# Patient Record
Sex: Female | Born: 1965 | Race: Black or African American | Hispanic: No | Marital: Single | Smoking: Never smoker
Health system: Southern US, Community
[De-identification: ages and names within clinical notes are randomized; demographics above are authoritative.]

## PROBLEM LIST (undated history)

## (undated) DIAGNOSIS — S82892A Other fracture of left lower leg, initial encounter for closed fracture: Secondary | ICD-10-CM

## (undated) DIAGNOSIS — Z973 Presence of spectacles and contact lenses: Secondary | ICD-10-CM

## (undated) DIAGNOSIS — J302 Other seasonal allergic rhinitis: Secondary | ICD-10-CM

## (undated) HISTORY — PX: NO PAST SURGERIES: SHX2092

---

## 2016-03-17 ENCOUNTER — Encounter (HOSPITAL_COMMUNITY): Payer: Self-pay | Admitting: *Deleted

## 2016-03-17 ENCOUNTER — Emergency Department (HOSPITAL_COMMUNITY): Payer: BLUE CROSS/BLUE SHIELD

## 2016-03-17 ENCOUNTER — Emergency Department (HOSPITAL_COMMUNITY)
Admission: EM | Admit: 2016-03-17 | Discharge: 2016-03-17 | Disposition: A | Discharge status: Home / Self Care | Payer: BLUE CROSS/BLUE SHIELD | Attending: Emergency Medicine | Admitting: Emergency Medicine

## 2016-03-17 DIAGNOSIS — X501XXA Overexertion from prolonged static or awkward postures, initial encounter: Secondary | ICD-10-CM | POA: Diagnosis not present

## 2016-03-17 DIAGNOSIS — Y9301 Activity, walking, marching and hiking: Secondary | ICD-10-CM | POA: Diagnosis not present

## 2016-03-17 DIAGNOSIS — Y9289 Other specified places as the place of occurrence of the external cause: Secondary | ICD-10-CM | POA: Insufficient documentation

## 2016-03-17 DIAGNOSIS — Y999 Unspecified external cause status: Secondary | ICD-10-CM | POA: Diagnosis not present

## 2016-03-17 DIAGNOSIS — S82852A Displaced trimalleolar fracture of left lower leg, initial encounter for closed fracture: Secondary | ICD-10-CM | POA: Insufficient documentation

## 2016-03-17 DIAGNOSIS — S99912A Unspecified injury of left ankle, initial encounter: Secondary | ICD-10-CM | POA: Diagnosis present

## 2016-03-17 MED ORDER — OXYCODONE-ACETAMINOPHEN 5-325 MG PO TABS: 1.0000 | ORAL_TABLET | Freq: Four times a day (QID) | ORAL | 0 refills | Status: DC | PRN

## 2016-03-17 MED ORDER — SODIUM CHLORIDE 0.9 % IV BOLUS (SEPSIS)
1000.0000 mL | Freq: Once | INTRAVENOUS | Status: AC
  Administered 2016-03-17: 1000 mL via INTRAVENOUS

## 2016-03-17 MED ORDER — PROPOFOL 10 MG/ML IV BOLUS
INTRAVENOUS | Status: AC | PRN
  Administered 2016-03-17: 30 mg via INTRAVENOUS
  Administered 2016-03-17: 25 mg via INTRAVENOUS
  Administered 2016-03-17: 50 mg via INTRAVENOUS

## 2016-03-17 MED ORDER — PROPOFOL 10 MG/ML IV BOLUS
1.0000 mg/kg | Freq: Once | INTRAVENOUS | Status: DC
  Filled 2016-03-17: qty 20

## 2016-03-17 MED ORDER — HYDROMORPHONE HCL 1 MG/ML IJ SOLN
1.0000 mg | Freq: Once | INTRAMUSCULAR | Status: AC
  Administered 2016-03-17: 1 mg via INTRAVENOUS
  Filled 2016-03-17: qty 1

## 2016-03-17 NOTE — ED Provider Notes (Signed)
[facility]-EMERGENCY DEPT Provider Note   CSN: [id number] Arrival date & time: 03/17/16  4098  By signing my name below, I, Sonum Patel, attest that this documentation has been prepared under the direction and in the presence of Roxy Horseman, PA-C. Electronically Signed: Sonum Patel, Neurosurgeon. 03/17/16. 10:12 AM.  History   Chief Complaint Chief Complaint  Patient presents with  . Ankle Pain    The history is provided by the patient. No language interpreter was used.     HPI Comments: Amy Murphy is a 51 y.o. female brought in by ambulance, who presents to the Emergency Department complaining of a left ankle injury that occurred PTA. Patient states she slipped on ice causing her left leg to fold underneath her. She currently has constant left ankle pain which has slightly improved since the injury occurred. She has associated swelling to the affected area. She denies numbness, head injury, LOC.   History reviewed. No pertinent past medical history.  There are no active problems to display for this patient.   History reviewed. No pertinent surgical history.  OB History    No data available       Home Medications    Prior to Admission medications   Not on File    Family History No family history on file.  Social History Social History  Substance Use Topics  . Smoking status: Not on file  . Smokeless tobacco: Not on file  . Alcohol use Not on file     Allergies   Patient has no known allergies.   Review of Systems Review of Systems  Musculoskeletal: Positive for arthralgias and joint swelling.  Neurological: Negative for syncope and numbness.     Physical Exam Updated Vital Signs BP 152/79 (BP Location: Left Arm)   Pulse 100   Temp 98.1 F (36.7 C) (Oral)   Resp 14   Ht 5\' 6"  (1.676 m)   Wt 210 lb (95.3 kg)   SpO2 100%   BMI 33.89 kg/m   Physical Exam Physical Exam  Constitutional: Pt appears well-developed and well-nourished. No distress.  HENT:    Head: Normocephalic and atraumatic.  Eyes: Conjunctivae are normal.  Neck: Normal range of motion.  Cardiovascular: Normal rate, regular rhythm and intact distal pulses.   Capillary refill < 3 sec  Pulmonary/Chest: Effort normal and breath sounds normal.  Musculoskeletal:  Left Ankle: Severely deformed, everted and laterally rotated out of normal anatomic position, no open wounds Pt exhibits tenderness diffusely. Pt exhibits moderate edema.  ROM: deferred 2/2 pain  Neurological: Pt  is alert. Coordination normal.  Sensation 5/5 Strength deferred 2/2 pain  Skin: Skin is warm and dry. Pt is not diaphoretic.  No tenting of the skin  Psychiatric: Pt has a normal mood and affect.  Nursing note and vitals reviewed.   ED Treatments / Results  DIAGNOSTIC STUDIES: Oxygen Saturation is 100% on RA, normal by my interpretation.    COORDINATION OF CARE: 10:17 AM Discussed treatment plan with pt at bedside and pt agreed to plan.   Labs (all labs ordered are listed, but only abnormal results are displayed) Labs Reviewed - No data to display  EKG  EKG Interpretation None       Radiology Dg Ankle Complete Left  Result Date: 03/17/2016 CLINICAL DATA:  Left ankle pain and deformity secondary to a fall outside today. EXAM: LEFT ANKLE COMPLETE - 3+ VIEW COMPARISON:  None. FINDINGS: There is a trimalleolar fracture dislocation at the left ankle. There is marked  comminution of the distal tibia. Patient has a flatfoot deformity with prominent dorsal osteophytes at the talonavicular joint. The anatomic relationship of the talus to the calcaneus is not defined well on this exam. IMPRESSION: Trimalleolar fracture dislocation. Abnormal talocalcaneal configuration could be the result acute or remote trauma. Electronically Signed   By: Francene BoyersJames  Maxwell M.D.   On: 03/17/2016 09:58    Procedures Procedures (including critical care time)  Medications Ordered in ED Medications  HYDROmorphone (DILAUDID)  injection 1 mg (not administered)     Initial Impression / Assessment and Plan / ED Course  I have reviewed the triage vital signs and the nursing notes.  Pertinent labs & imaging results that were available during my care of the patient were reviewed by me and considered in my medical decision making (see chart for details).     Patient X-Ray positive for trimalleolar fracture dislocation.   I discussed the case with Dr. Aundria Rudogers of Orthopedics, who recommends reduction in the ED, splint, and follow-up in his office in a week. Reduction preformed by Dr. Criss AlvineGoldston.  Patient will be discharged home & is agreeable with above plan. Returns precautions discussed. Pt appears safe for discharge.   Final Clinical Impressions(s) / ED Diagnoses   Final diagnoses:  Left trimalleolar fracture, closed, initial encounter    New Prescriptions New Prescriptions   OXYCODONE-ACETAMINOPHEN (PERCOCET/ROXICET) 5-325 MG TABLET    Take 1-2 tablets by mouth every 6 (six) hours as needed for severe pain.   I personally performed the services described in this documentation, which was scribed in my presence. The recorded information has been reviewed and is accurate.      Roxy Horsemanobert Jayla Mackie, PA-C 03/17/16 1345    Pricilla LovelessScott Goldston, MD 03/17/16 Serena [id number]

## 2016-03-17 NOTE — Progress Notes (Signed)
   03/17/16 0000  CM Assessment  Expected Discharge Plan Home/Self Care  In-house Referral NA  Discharge Planning Services CM Consult  [facility]  Choice offered to / list presented to  Patient  DME Arranged N/A  DME Agency [facility]  HH Arranged NA  [facility] NA  Status of Service Completed, signed off  Discharge Disposition Home/Self Care    EDP , Goldston spoke with ED Cm about assist to get pt a w/c for home d/c since pt having [facility] after left ankle fx (Trimalleolar fracture dislocation)  CM lvm for [facility]  Cm called and spoke with Jermaine at [facility] who states w/c is a [facility] pick item and will be processed Pt/family will need to call store prior to picking up w/c at store ED CM updated EDP who is in pt room Pt and family are trying to decide how to get pt home either via ems (?fee) or via family car. Female family member agreed to pick up w/c at [facility] Cm able to give female family member the [address] [place] address with the contact number of [phone] and encouraged calling before going to the store to pick up DME.  Family voiced understanding Order in EPIC to be co signed per Dr Criss AlvineGoldston

## 2016-03-17 NOTE — ED Notes (Signed)
Bed: WTR6 Expected date:  Expected time:  Means of arrival:  Comments: 

## 2016-03-17 NOTE — ED Notes (Signed)
Multiple attempts to call parents from lobby sine procedural completion; no response. This writer attempt to call father at number provided in demographics; no answer. Pt aware unable to get a hold of parents. Pt does not have other contact information.

## 2016-03-17 NOTE — Progress Notes (Signed)
Patient suffers from fracture of left ankle (Trimalleolar fracture dislocation) and is having difficulty with ambulating with crutches in emergency room which impairs their ability to perform daily activities like mobility, bathing, dressing, etc in the home.  A  Cane nor crutch will not resolve  issue with performing activities of daily living. A wheelchair will allow patient to safely perform daily activities. Patient can safely propel the wheelchair in the home or has a caregiver who can provide assistance.  Accessories: elevating leg rests (ELRs), wheel locks, extensions and anti-tippers.

## 2016-03-17 NOTE — Discharge Instructions (Signed)
You will need to see Dr. Aundria Rudogers, who is an orthopedic surgeon.  Call his office and make an appointment.  Do not put any weight on your left leg.  Use the crutches at all times.  Take the pain medication as prescribed.

## 2016-03-17 NOTE — ED Triage Notes (Signed)
EMS reports pt was walking outside of house, slipped on ice, left leg went under her, ?left ankle deformity, swelling noted, pain in lower leg and ankle, No head injury, No Loc.

## 2016-03-17 NOTE — ED Provider Notes (Signed)
Procedural sedation Performed by: Pricilla LovelessGOLDSTON, Valor [place] T Consent: Verbal consent obtained. Risks and benefits: risks, benefits and alternatives were discussed Required items: required blood products, implants, devices, and special equipment available Patient identity confirmed: arm band and provided demographic data Time out: Immediately prior to procedure a "time out" was called to verify the correct patient, procedure, equipment, support staff and site/side marked as required.  Sedation type: moderate (conscious) sedation NPO time confirmed and considedered  Sedatives: PROPOFOL  Physician Time at Bedside: 15 minutes  Vitals: Vital signs were monitored during sedation. Cardiac Monitor, pulse oximeter Patient tolerance: Patient tolerated the procedure well with no immediate complications. Comments: Pt with uneventful recovered. Returned to pre-procedural sedation baseline    Reduction of dislocation Date/Time: 12:05 PM Performed by: Audree CamelGOLDSTON, Cynia Abruzzo T Authorized by: Pricilla LovelessGOLDSTON, Amorette Charrette T Consent: Verbal consent obtained. Risks and benefits: risks, benefits and alternatives were discussed Consent given by: patient Required items: required blood products, implants, devices, and special equipment available Time out: Immediately prior to procedure a "time out" was called to verify the correct patient, procedure, equipment, support staff and site/side marked as required.  Patient sedated: Propofol  Vitals: Vital signs were monitored during sedation. Patient tolerance: Patient tolerated the procedure well with no immediate complications. Joint: left ankle - trimalleolar fracture Reduction technique: traction/counter traction       Pricilla LovelessScott Haidyn Chadderdon, MD 03/17/16 1927

## 2016-03-17 NOTE — ED Notes (Signed)
Respiratory therapist and ortho technician called and en route to bedside for moderate sedation.

## 2016-03-17 NOTE — ED Notes (Signed)
Bed: WHALD Expected date:  Expected time:  Means of arrival:  Comments: 

## 2016-03-17 NOTE — ED Notes (Signed)
Bed: [id number] Expected date:  Expected time:  Means of arrival:  Comments: TR6

## 2016-03-29 ENCOUNTER — Encounter (HOSPITAL_COMMUNITY): Payer: Self-pay | Admitting: *Deleted

## 2016-03-29 ENCOUNTER — Encounter (HOSPITAL_COMMUNITY)
Admission: RE | Admit: 2016-03-29 | Discharge: 2016-03-29 | Disposition: A | Discharge status: Home / Self Care | Payer: BLUE CROSS/BLUE SHIELD | Source: Ambulatory Visit | Attending: Orthopedic Surgery | Admitting: Orthopedic Surgery

## 2016-03-29 DIAGNOSIS — S82852A Displaced trimalleolar fracture of left lower leg, initial encounter for closed fracture: Secondary | ICD-10-CM | POA: Diagnosis not present

## 2016-03-29 DIAGNOSIS — Z886 Allergy status to analgesic agent status: Secondary | ICD-10-CM | POA: Diagnosis not present

## 2016-03-29 DIAGNOSIS — W19XXXA Unspecified fall, initial encounter: Secondary | ICD-10-CM | POA: Diagnosis not present

## 2016-03-29 DIAGNOSIS — Z7982 Long term (current) use of aspirin: Secondary | ICD-10-CM | POA: Diagnosis not present

## 2016-03-29 HISTORY — DX: Other fracture of left lower leg, initial encounter for closed fracture: S82.892A

## 2016-03-29 HISTORY — DX: Presence of spectacles and contact lenses: Z97.3

## 2016-03-29 HISTORY — DX: Other seasonal allergic rhinitis: J30.2

## 2016-03-29 LAB — HCG, SERUM, QUALITATIVE: Preg, Serum: NEGATIVE

## 2016-03-29 LAB — CBC
HEMATOCRIT: 41.9 % (ref 36.0–46.0)
Hemoglobin: 13.9 g/dL (ref 12.0–15.0)
MCH: 30 pg (ref 26.0–34.0)
MCHC: 33.2 g/dL (ref 30.0–36.0)
MCV: 90.5 fL (ref 78.0–100.0)
Platelets: 314 10*3/uL (ref 150–400)
RBC: 4.63 MIL/uL (ref 3.87–5.11)
RDW: 13 % (ref 11.5–15.5)
WBC: 5.7 10*3/uL (ref 4.0–10.5)

## 2016-03-29 LAB — SURGICAL PCR SCREEN
MRSA, PCR: NEGATIVE
STAPHYLOCOCCUS AUREUS: POSITIVE — AB

## 2016-03-29 LAB — NO BLOOD PRODUCTS

## 2016-03-29 NOTE — Pre-Procedure Instructions (Signed)
Amy Murphy  03/29/2016      [id number] [place] [place] [facility] [address] [place] [address] Phone: [phone] Fax: [phone]  [facility] [address] - [place], [place] - [name] [place] [address] Phone: [phone] Fax: [phone]    Your procedure is scheduled on Friday, March 31, 2016  [facility]:30 [facility].M.  Call this number if you have problems the morning of surgery:  [phone]   Remember:  Do not eat food or drink liquids after midnight Thursday, March 30, 2016  Take these medicines the morning of surgery with A SIP OF WATER : None Stop taking Aspirin, vitamins, fish oil and herbal medications. Do not take any NSAIDs ie: Ibuprofen, Advil, Naproxen, BC and Goody Powder or any medication containing Aspirin; stop now.  Do not wear jewelry, make-up or nail polish.  Do not wear lotions, powders, or perfumes, or deoderant.  Do not shave 48 hours prior to surgery.    Do not bring valuables to the hospital.  [facility] is not responsible for any belongings or valuables.  Contacts, dentures or bridgework may not be worn into surgery.  Leave your suitcase in the car.  After surgery it may be brought to your room.  For patients admitted to the hospital, discharge time will be determined by your treatment team.  Patients discharged the day of surgery will not be allowed to drive home.  Special instructions:  : [place] - Preparing for Surgery  Before surgery, you can play an important role.  Because skin is not sterile, your skin needs to be as free of germs as possible.  You can reduce the number of germs on you skin by washing with CHG (chlorahexidine gluconate) soap before surgery.  CHG is an antiseptic cleaner which kills germs and bonds with the skin to continue killing germs even after washing.  Please DO NOT use if you have an allergy to CHG or antibacterial  soaps.  If your skin becomes reddened/irritated stop using the CHG and inform your nurse when you arrive at Short Stay.  Do not shave (including legs and underarms) for at least 48 hours prior to the first CHG shower.  You may shave your face.  Please follow these instructions carefully:   1.  Shower with CHG Soap the night before surgery and the morning of Surgery.  2.  If you choose to wash your hair, wash your hair first as usual with your normal shampoo.  3.  After you shampoo, rinse your hair and body thoroughly to remove the Shampoo.  4.  Use CHG as you would any other liquid soap.  You can apply chg directly  to the skin and wash gently with scrungie or a clean washcloth.  5.  Apply the CHG Soap to your body ONLY FROM THE NECK DOWN.  Do not use on open wounds or open sores.  Avoid contact with your eyes, ears, mouth and genitals (private parts).  Wash genitals (private parts) with your normal soap.  6.  Wash thoroughly, paying special attention to the area where your surgery will be performed.  7.  Thoroughly rinse your body with warm water from the neck down.  8.  DO NOT shower/wash with your normal soap after using and rinsing off the CHG Soap.  9.  Pat yourself dry with a clean towel.  10.  Wear clean pajamas.            11.  Place clean sheets on your bed the night of your first shower and do not sleep with pets.  Day of Surgery  Do not apply any lotions/deoderants the morning of surgery.  Please wear clean clothes to the hospital/surgery center.  Please read over the following fact sheets that you were given. Pain Booklet, Coughing and Deep Breathing, MRSA Information and Surgical Site Infection Prevention

## 2016-03-29 NOTE — Progress Notes (Signed)
Pt denies SOB, chest pain, and being under the care of a cardiologist. Pt denies having a stress test, echo and cardiac cath. Pt denies having an EKG and chest x ray within the last year. LVM with Sherri, Surgical Coordinator to make MD aware that pt refuses all blood products.  Pt tested + staph ( Surgical PCR); please treat with Betadine ( per protocol ) on DOS.

## 2016-03-31 ENCOUNTER — Ambulatory Visit (HOSPITAL_COMMUNITY): Payer: BLUE CROSS/BLUE SHIELD

## 2016-03-31 ENCOUNTER — Encounter (HOSPITAL_COMMUNITY)
Admission: RE | Disposition: A | Discharge status: Home / Self Care | Payer: Self-pay | Source: Ambulatory Visit | Attending: Orthopedic Surgery

## 2016-03-31 ENCOUNTER — Observation Stay (HOSPITAL_COMMUNITY)
Admission: RE | Admit: 2016-03-31 | Discharge: 2016-04-01 | Disposition: A | Discharge status: Home / Self Care | Payer: BLUE CROSS/BLUE SHIELD | Source: Ambulatory Visit | Attending: Orthopedic Surgery | Admitting: Orthopedic Surgery

## 2016-03-31 ENCOUNTER — Ambulatory Visit (HOSPITAL_COMMUNITY): Payer: BLUE CROSS/BLUE SHIELD | Admitting: Anesthesiology

## 2016-03-31 ENCOUNTER — Encounter (HOSPITAL_COMMUNITY): Payer: Self-pay | Admitting: Surgery

## 2016-03-31 DIAGNOSIS — Z419 Encounter for procedure for purposes other than remedying health state, unspecified: Secondary | ICD-10-CM

## 2016-03-31 DIAGNOSIS — W19XXXA Unspecified fall, initial encounter: Secondary | ICD-10-CM | POA: Insufficient documentation

## 2016-03-31 DIAGNOSIS — S82852A Displaced trimalleolar fracture of left lower leg, initial encounter for closed fracture: Principal | ICD-10-CM | POA: Diagnosis present

## 2016-03-31 DIAGNOSIS — Z7982 Long term (current) use of aspirin: Secondary | ICD-10-CM | POA: Insufficient documentation

## 2016-03-31 DIAGNOSIS — Z886 Allergy status to analgesic agent status: Secondary | ICD-10-CM | POA: Insufficient documentation

## 2016-03-31 DIAGNOSIS — R262 Difficulty in walking, not elsewhere classified: Secondary | ICD-10-CM

## 2016-03-31 HISTORY — PX: ORIF ANKLE FRACTURE: SHX5408

## 2016-03-31 LAB — CREATININE, SERUM
Creatinine, Ser: 0.61 mg/dL (ref 0.44–1.00)
GFR calc Af Amer: >60 mL/min (ref >60)

## 2016-03-31 LAB — CBC
HEMATOCRIT: 41.3 % (ref 36.0–46.0)
HEMOGLOBIN: 13.4 g/dL (ref 12.0–15.0)
MCH: 29.2 pg (ref 26.0–34.0)
MCHC: 32.4 g/dL (ref 30.0–36.0)
MCV: 90 fL (ref 78.0–100.0)
Platelets: 286 10*3/uL (ref 150–400)
RBC: 4.59 MIL/uL (ref 3.87–5.11)
RDW: 12.7 % (ref 11.5–15.5)
WBC: 6.9 10*3/uL (ref 4.0–10.5)

## 2016-03-31 SURGERY — OPEN REDUCTION INTERNAL FIXATION (ORIF) ANKLE FRACTURE
Anesthesia: General | Site: Ankle | Laterality: Left

## 2016-03-31 MED ORDER — LIDOCAINE HCL (CARDIAC) 20 MG/ML IV SOLN
INTRAVENOUS | Status: DC | PRN
  Administered 2016-03-31: 100 mg via INTRAVENOUS

## 2016-03-31 MED ORDER — PHENYLEPHRINE HCL 10 MG/ML IJ SOLN
INTRAMUSCULAR | Status: DC | PRN
  Administered 2016-03-31 (×2): 80 ug via INTRAVENOUS
  Administered 2016-03-31 (×2): 120 ug via INTRAVENOUS

## 2016-03-31 MED ORDER — CEFAZOLIN SODIUM-DEXTROSE 2-4 GM/100ML-% IV SOLN
INTRAVENOUS | Status: AC
  Filled 2016-03-31: qty 100

## 2016-03-31 MED ORDER — ACETAMINOPHEN 500 MG PO TABS
1000.0000 mg | ORAL_TABLET | Freq: Four times a day (QID) | ORAL | Status: DC
  Administered 2016-03-31 – 2016-04-01 (×3): 1000 mg via ORAL
  Filled 2016-03-31 (×3): qty 2

## 2016-03-31 MED ORDER — BUPIVACAINE HCL (PF) 0.25 % IJ SOLN
INTRAMUSCULAR | Status: AC
  Filled 2016-03-31: qty 30

## 2016-03-31 MED ORDER — ONDANSETRON HCL 4 MG/2ML IJ SOLN
INTRAMUSCULAR | Status: DC | PRN
  Administered 2016-03-31: 4 mg via INTRAVENOUS

## 2016-03-31 MED ORDER — MIDAZOLAM HCL 2 MG/2ML IJ SOLN
INTRAMUSCULAR | Status: AC
Start: 2016-03-31 — End: 2016-03-31
  Administered 2016-03-31: 2 mg
  Filled 2016-03-31: qty 2

## 2016-03-31 MED ORDER — FENTANYL CITRATE (PF) 100 MCG/2ML IJ SOLN
INTRAMUSCULAR | Status: AC
  Filled 2016-03-31: qty 2

## 2016-03-31 MED ORDER — MORPHINE SULFATE (PF) 2 MG/ML IV SOLN: 2.0000 mg | INTRAVENOUS | Status: DC | PRN

## 2016-03-31 MED ORDER — PROPOFOL 10 MG/ML IV BOLUS
INTRAVENOUS | Status: AC
  Filled 2016-03-31: qty 20

## 2016-03-31 MED ORDER — LACTATED RINGERS IV SOLN: INTRAVENOUS | Status: DC

## 2016-03-31 MED ORDER — ACETAMINOPHEN 650 MG RE SUPP: 650.0000 mg | Freq: Four times a day (QID) | RECTAL | Status: DC | PRN

## 2016-03-31 MED ORDER — ASPIRIN EC 81 MG PO TBEC: 81.0000 mg | DELAYED_RELEASE_TABLET | Freq: Every day | ORAL | 0 refills | Status: AC

## 2016-03-31 MED ORDER — OXYCODONE HCL 5 MG PO TABS: 5.0000 mg | ORAL_TABLET | ORAL | 0 refills | Status: AC | PRN

## 2016-03-31 MED ORDER — MEPERIDINE HCL 25 MG/ML IJ SOLN: 6.2500 mg | INTRAMUSCULAR | Status: DC | PRN

## 2016-03-31 MED ORDER — METHOCARBAMOL 500 MG PO TABS: 500.0000 mg | ORAL_TABLET | Freq: Four times a day (QID) | ORAL | Status: DC | PRN

## 2016-03-31 MED ORDER — METOCLOPRAMIDE HCL 5 MG/ML IJ SOLN: 10.0000 mg | Freq: Once | INTRAMUSCULAR | Status: DC | PRN

## 2016-03-31 MED ORDER — FENTANYL CITRATE (PF) 100 MCG/2ML IJ SOLN
INTRAMUSCULAR | Status: AC
  Administered 2016-03-31: 100 ug
  Filled 2016-03-31: qty 2

## 2016-03-31 MED ORDER — LACTATED RINGERS IV SOLN
INTRAVENOUS | Status: DC
  Administered 2016-03-31 (×2): via INTRAVENOUS

## 2016-03-31 MED ORDER — BUPIVACAINE-EPINEPHRINE (PF) 0.5% -1:200000 IJ SOLN
INTRAMUSCULAR | Status: DC | PRN
  Administered 2016-03-31 (×2): 20 mL via PERINEURAL

## 2016-03-31 MED ORDER — ONDANSETRON 4 MG PO TBDP: 4.0000 mg | ORAL_TABLET | Freq: Three times a day (TID) | ORAL | 0 refills | Status: AC | PRN

## 2016-03-31 MED ORDER — METHOCARBAMOL 1000 MG/10ML IJ SOLN
500.0000 mg | Freq: Four times a day (QID) | INTRAMUSCULAR | Status: DC | PRN
  Filled 2016-03-31: qty 5

## 2016-03-31 MED ORDER — DEXAMETHASONE SODIUM PHOSPHATE 10 MG/ML IJ SOLN
INTRAMUSCULAR | Status: DC | PRN
  Administered 2016-03-31: 5 mg via INTRAVENOUS

## 2016-03-31 MED ORDER — FENTANYL CITRATE (PF) 100 MCG/2ML IJ SOLN
INTRAMUSCULAR | Status: DC | PRN
  Administered 2016-03-31 (×2): 50 ug via INTRAVENOUS
  Administered 2016-03-31: 100 ug via INTRAVENOUS

## 2016-03-31 MED ORDER — CHLORHEXIDINE GLUCONATE 4 % EX LIQD: 60.0000 mL | Freq: Once | CUTANEOUS | Status: DC

## 2016-03-31 MED ORDER — PROPOFOL 10 MG/ML IV BOLUS
INTRAVENOUS | Status: DC | PRN
  Administered 2016-03-31: 200 mg via INTRAVENOUS

## 2016-03-31 MED ORDER — ONDANSETRON HCL 4 MG PO TABS: 4.0000 mg | ORAL_TABLET | Freq: Four times a day (QID) | ORAL | Status: DC | PRN

## 2016-03-31 MED ORDER — ACETAMINOPHEN 325 MG PO TABS: 650.0000 mg | ORAL_TABLET | Freq: Four times a day (QID) | ORAL | Status: DC | PRN

## 2016-03-31 MED ORDER — ONDANSETRON HCL 4 MG/2ML IJ SOLN: 4.0000 mg | Freq: Four times a day (QID) | INTRAMUSCULAR | Status: DC | PRN

## 2016-03-31 MED ORDER — MIDAZOLAM HCL 5 MG/5ML IJ SOLN: INTRAMUSCULAR | Status: DC | PRN

## 2016-03-31 MED ORDER — ENOXAPARIN SODIUM 40 MG/0.4ML ~~LOC~~ SOLN
40.0000 mg | SUBCUTANEOUS | Status: DC
  Filled 2016-03-31: qty 0.4

## 2016-03-31 MED ORDER — CEFAZOLIN SODIUM-DEXTROSE 2-4 GM/100ML-% IV SOLN
2.0000 g | INTRAVENOUS | Status: AC
  Administered 2016-03-31: 2 g via INTRAVENOUS

## 2016-03-31 MED ORDER — FENTANYL CITRATE (PF) 100 MCG/2ML IJ SOLN: 25.0000 ug | INTRAMUSCULAR | Status: DC | PRN

## 2016-03-31 MED ORDER — MIDAZOLAM HCL 2 MG/2ML IJ SOLN
INTRAMUSCULAR | Status: AC
  Filled 2016-03-31: qty 2

## 2016-03-31 MED ORDER — OXYCODONE HCL 5 MG PO TABS: 5.0000 mg | ORAL_TABLET | ORAL | Status: DC | PRN

## 2016-03-31 MED ORDER — 0.9 % SODIUM CHLORIDE (POUR BTL) OPTIME
TOPICAL | Status: DC | PRN
  Administered 2016-03-31: 1000 mL

## 2016-03-31 SURGICAL SUPPLY — 63 items
BANDAGE ACE 4X5 VEL STRL LF (GAUZE/BANDAGES/DRESSINGS) ×3 IMPLANT
BANDAGE ACE 6X5 VEL STRL LF (GAUZE/BANDAGES/DRESSINGS) IMPLANT
BANDAGE ESMARK 6X9 LF (GAUZE/BANDAGES/DRESSINGS) IMPLANT
BIT DRILL LCP QC 2X140 (BIT) ×3 IMPLANT
BLADE MICRO 25.3X4.1 (BLADE) ×2 IMPLANT
BLADE MICRO 25.3X4.1MM (BLADE) ×1
BNDG COHESIVE 4X5 TAN STRL (GAUZE/BANDAGES/DRESSINGS) ×3 IMPLANT
BNDG ESMARK 6X9 LF (GAUZE/BANDAGES/DRESSINGS)
CANISTER SUCT 3000ML PPV (MISCELLANEOUS) ×3 IMPLANT
COVER SURGICAL LIGHT HANDLE (MISCELLANEOUS) ×3 IMPLANT
CUFF TOURNIQUET SINGLE 34IN LL (TOURNIQUET CUFF) IMPLANT
CUFF TOURNIQUET SINGLE 44IN (TOURNIQUET CUFF) ×3 IMPLANT
DRAPE C-ARM 42X72 X-RAY (DRAPES) ×3 IMPLANT
DRAPE C-ARMOR (DRAPES) ×3 IMPLANT
DRAPE U-SHAPE 47X51 STRL (DRAPES) ×3 IMPLANT
DRSG ADAPTIC 3X8 NADH LF (GAUZE/BANDAGES/DRESSINGS) ×3 IMPLANT
DRSG PAD ABDOMINAL 8X10 ST (GAUZE/BANDAGES/DRESSINGS) ×3 IMPLANT
DURAPREP 26ML APPLICATOR (WOUND CARE) ×3 IMPLANT
ELECT CAUTERY BLADE 6.4 (BLADE) IMPLANT
ELECT REM PT RETURN 9FT ADLT (ELECTROSURGICAL) ×3
ELECTRODE REM PT RTRN 9FT ADLT (ELECTROSURGICAL) ×1 IMPLANT
GAUZE SPONGE 4X4 12PLY STRL (GAUZE/BANDAGES/DRESSINGS) ×3 IMPLANT
GLOVE BIO SURGEON STRL SZ 6 (GLOVE) ×3 IMPLANT
GLOVE BIO SURGEON STRL SZ7.5 (GLOVE) ×3 IMPLANT
GLOVE BIOGEL PI IND STRL 6.5 (GLOVE) ×1 IMPLANT
GLOVE BIOGEL PI IND STRL 7.5 (GLOVE) ×1 IMPLANT
GLOVE BIOGEL PI IND STRL 8 (GLOVE) ×1 IMPLANT
GLOVE BIOGEL PI INDICATOR 6.5 (GLOVE) ×2
GLOVE BIOGEL PI INDICATOR 7.5 (GLOVE) ×2
GLOVE BIOGEL PI INDICATOR 8 (GLOVE) ×2
GLOVE ECLIPSE 6.5 STRL STRAW (GLOVE) ×3 IMPLANT
GOWN STRL REUS W/ TWL LRG LVL3 (GOWN DISPOSABLE) ×4 IMPLANT
GOWN STRL REUS W/ TWL XL LVL3 (GOWN DISPOSABLE) ×1 IMPLANT
GOWN STRL REUS W/TWL LRG LVL3 (GOWN DISPOSABLE) ×8
GOWN STRL REUS W/TWL XL LVL3 (GOWN DISPOSABLE) ×2
KIT BASIN OR (CUSTOM PROCEDURE TRAY) ×3 IMPLANT
KIT ROOM TURNOVER OR (KITS) ×3 IMPLANT
NEEDLE HYPO 25GX1X1/2 BEV (NEEDLE) IMPLANT
NS IRRIG 1000ML POUR BTL (IV SOLUTION) ×3 IMPLANT
PACK ORTHO EXTREMITY (CUSTOM PROCEDURE TRAY) ×3 IMPLANT
PAD ARMBOARD 7.5X6 YLW CONV (MISCELLANEOUS) ×6 IMPLANT
PAD CAST 4YDX4 CTTN HI CHSV (CAST SUPPLIES) ×1 IMPLANT
PADDING CAST COTTON 4X4 STRL (CAST SUPPLIES) ×2
PADDING CAST COTTON 6X4 STRL (CAST SUPPLIES) ×3 IMPLANT
PLATE 3 HOLE LCP HOOK 3.5MM (Plate) ×3 IMPLANT
PLATE BONE COMPRESSION 4-HOLE (Plate) ×3 IMPLANT
SCREW CORTEX 2.7X14MM (Screw) ×3 IMPLANT
SCREW LOCK THRD ST TIB 3.5X14 (Screw) ×2 IMPLANT
SCREW LOCK THRD ST TIB 3.5X16 (Screw) ×9 IMPLANT
SCREW METAPHYSCAL 18MM (Screw) ×3 IMPLANT
SPONGE LAP 18X18 X RAY DECT (DISPOSABLE) IMPLANT
SUCTION FRAZIER HANDLE 10FR (MISCELLANEOUS) ×4
SUCTION TUBE FRAZIER 10FR DISP (MISCELLANEOUS) ×2 IMPLANT
SUT ETHILON 3 0 PS 1 (SUTURE) ×6 IMPLANT
SUT VIC AB 0 CT1 27 (SUTURE) ×2
SUT VIC AB 0 CT1 27XBRD ANBCTR (SUTURE) ×1 IMPLANT
SUT VIC AB 2-0 CT1 27 (SUTURE) ×2
SUT VIC AB 2-0 CT1 TAPERPNT 27 (SUTURE) ×1 IMPLANT
SYR CONTROL 10ML LL (SYRINGE) IMPLANT
TOWEL OR 17X24 6PK STRL BLUE (TOWEL DISPOSABLE) ×3 IMPLANT
TOWEL OR 17X26 10 PK STRL BLUE (TOWEL DISPOSABLE) ×6 IMPLANT
TUBE CONNECTING 12'X1/4 (SUCTIONS) ×1
TUBE CONNECTING 12X1/4 (SUCTIONS) ×2 IMPLANT

## 2016-03-31 NOTE — Anesthesia Procedure Notes (Signed)
Anesthesia Regional Block:  Popliteal block  Pre-Anesthetic Checklist: ,, timeout performed, Correct Patient, Correct Site, Correct Laterality, Correct Procedure, Correct Position, site marked, Risks and benefits discussed,  Surgical consent,  Pre-op evaluation,  At surgeon's request and post-op pain management  Laterality: Left and Lower  Prep: Maximum Sterile Barrier Precautions used, chloraprep       Needles:  Injection technique: Single-shot  Needle Type: Echogenic Stimulator Needle     Needle Length: 10cm 10 cm Needle Gauge: 21 G    Additional Needles:  Procedures: ultrasound guided (picture in chart) Popliteal block Narrative:  Start time: 03/31/2016 1:52 PM End time: 03/31/2016 1:57 PM Injection made incrementally with aspirations every 5 mL.  Performed by: Personally  Anesthesiologist: Phillips GroutARIGNAN, Gracyn Santillanes  Additional Notes: Risks, benefits and alternative to block explained extensively.  Patient tolerated procedure well, without complications.

## 2016-03-31 NOTE — H&P (Signed)
   ORTHOPAEDIC CONSULTATION  REQUESTING PHYSICIAN: Yolonda KidaJason Patrick Julitza Rickles, MD  PCP:  No PCP Per Patient  Chief Complaint: left ankle fracture  HPI: Amy Murphy is a 51 y.o. female who complains of  Left ankle fracture.  She has been seen and evaluated by myself in the office and recommended surgical management.  I have reviewed the case with her and her family and she is here today for operative fixation.    Past Medical History:  Diagnosis Date  . Closed left ankle fracture   . Seasonal allergies   . Wears glasses    Past Surgical History:  Procedure Laterality Date  . NO PAST SURGERIES     Social History   Social History  . Marital status: Single    Spouse name: N/A  . Number of children: N/A  . Years of education: N/A   Social History Main Topics  . Smoking status: Never Smoker  . Smokeless tobacco: Never Used  . Alcohol use No  . Drug use: No  . Sexual activity: Not Asked   Other Topics Concern  . None   Social History Narrative  . None   Family History  Problem Relation Age of Onset  . Obesity Sister    Allergies  Allergen Reactions  . Aspirin Other (See Comments)    Nosebleeds  . Ibuprofen Other (See Comments)    Vaginal bleeding   Prior to Admission medications   Medication Sig Start Date End Date Taking? Authorizing Provider  oxyCODONE-acetaminophen (PERCOCET/ROXICET) 5-325 MG tablet Take 1-2 tablets by mouth every 6 (six) hours as needed for severe pain. Patient not taking: Reported on 03/23/2016 03/17/16   Roxy Horsemanobert Browning, PA-C   No results found.  Positive ROS: All other systems have been reviewed and were otherwise negative with the exception of those mentioned in the HPI and as above.  Physical Exam: General: Alert, no acute distress Cardiovascular: No pedal edema Respiratory: No cyanosis, no use of accessory musculature GI: No organomegaly, abdomen is soft and non-tender Skin: No lesions in the area of chief complaint Neurologic:  Sensation intact distally Psychiatric: Patient is competent for consent with normal mood and affect Lymphatic: No axillary or cervical lymphadenopathy  Assessment: Left ankle trimalleolar ankle fracture  Plan: -to OR today for ORIF -NWB -admit post op for pain control -dc am    Yolonda KidaJason Patrick Trelyn Vanderlinde, MD Cell [phone]    03/31/2016 12:49 PM

## 2016-03-31 NOTE — Op Note (Signed)
Date of Surgery: 03/31/2016  INDICATIONS: Amy Murphy is a 51 y.o.-year-old female who sustained a left trimalleolar ankle fracture; she was indicated for open reduction and internal fixation due to the displaced nature of the articular fracture and came to the operating room today for this procedure. The patient did consent to the procedure after discussion of the risks and benefits.  PREOPERATIVE DIAGNOSIS: left trimalleolar ankle fracture  POSTOPERATIVE DIAGNOSIS: Same.  PROCEDURE: Open treatment of left ankle fracture with internal fixation. Trimalleolar w/o fixation of posterior malleolus CPT 27822.    SURGEON: Naksh Radi P. Aundria Rud, M.D.  ASSIST: Patrick Jupiter, RNFA.  ANESTHESIA:  General and regional  TOURNIQUET TIME: 80 min  IV FLUIDS AND URINE: See anesthesia.  ESTIMATED BLOOD LOSS: 30 mL.  IMPLANTS: Synthes small fragment hook plate with 3.5 mm screws medially Synthes anatomic distal fibula locking plate laterally  COMPLICATIONS: None.  DESCRIPTION OF PROCEDURE: The patient was brought to the operating room and placed supine on the operating table.  The patient had been signed prior to the procedure and this was documented. The patient had the anesthesia placed by the anesthesiologist.  A nonsterile tourniquet was placed on the upper thigh.  The prep verification and incision time-outs were performed to confirm that this was the correct patient, site, side and location. The patient had an SCD on the opposite lower extremity. The patient did receive antibiotics prior to the incision and was re-dosed during the procedure as needed at indicated intervals.  The patient had the lower extremity prepped and draped in the standard surgical fashion.  The extremity was exsanguinated using an esmarch bandage and the tourniquet was inflated to 300 mm Hg.   Incision was made over the distal fibula and the fracture was exposed and reduced anatomically with a clamp. A locking distal fibula  plate was applied to neutralize the fracture due to poor bone quality.  Bone quality was poor. I used c-arm to confirm satisfactory reduction and fixation. Distally we used locking screws and proximally we used nonlocking screws.  I then turned my attention to the medial malleolus. Incision was made over the medial malleolus and the fracture exposed and held provisionally with a clamp. The orientation of the fracture would not allow for independently placed lag screws and we elected to utilize a hook plate as an antiglide fixation method.  The fracture was reduced directly, plate applied and two proximal nonlocking cortical screws placed bicortically across the tibia.   The syndesmosis was stressed using live fluoroscopy and found to be stable.   Radiography:  I performed AP, lateral, mortise radiography as well as stress radiography on the mortise view to evaluate the fixation of the fractures length of screws and plate positions. These were all satisfactory at the end of the case.  The wounds were irrigated, and closed with vicryl with routine closure for the skin. The wounds were injected with local anesthetic. Sterile gauze was applied followed by a posterior splint. She was awakened and returned to the PACU in stable and satisfactory condition. There were no complications.  Counts were all correct.  POSTOPERATIVE PLAN: Amy Murphy will remain nonweightbearing on this leg for approximately 8 weeks; Amy Murphy will return for suture removal in 2 weeks.  She will be immobilized in a short leg splint and then transitioned to a CAM walker at his first follow up appointment.  Amy Murphy will receive DVT prophylaxis based on other medications, activity level, and risk ratio of bleeding to  thrombosis.  Amy RuedJason P Eleina Jergens, MD [facility] [phone] 4:12 PM

## 2016-03-31 NOTE — Anesthesia Preprocedure Evaluation (Addendum)
Anesthesia Evaluation  Patient identified by MRN, date of birth, ID band Patient awake    Reviewed: Allergy & Precautions, NPO status , Patient's Chart, lab work & pertinent test results  Airway Mallampati: II  TM Distance: >3 FB Neck ROM: Full    Dental no notable dental hx. (+) Caps   Pulmonary neg pulmonary ROS,    Pulmonary exam normal breath sounds clear to auscultation       Cardiovascular negative cardio ROS Normal cardiovascular exam Rhythm:Regular Rate:Normal     Neuro/Psych negative neurological ROS  negative psych ROS   GI/Hepatic negative GI ROS, Neg liver ROS,   Endo/Other  negative endocrine ROS  Renal/GU negative Renal ROS  negative genitourinary   Musculoskeletal negative musculoskeletal ROS (+)   Abdominal   Peds negative pediatric ROS (+)  Hematology negative hematology ROS (+)   Anesthesia Other Findings   Reproductive/Obstetrics negative OB ROS                             Anesthesia Physical Anesthesia Plan  ASA: II  Anesthesia Plan: General   Post-op Pain Management:  Regional for Post-op pain   Induction: Intravenous  Airway Management Planned: LMA  Additional Equipment:   Intra-op Plan:   Post-operative Plan: Extubation in OR  Informed Consent: I have reviewed the patients History and Physical, chart, labs and discussed the procedure including the risks, benefits and alternatives for the proposed anesthesia with the patient or authorized representative who has indicated his/her understanding and acceptance.   Dental advisory given  Plan Discussed with: CRNA  Anesthesia Plan Comments: (Left adductor and popliteal block for post op pain)        Anesthesia Quick Evaluation

## 2016-03-31 NOTE — Transfer of Care (Signed)
Immediate Anesthesia Transfer of Care Note  Patient: Amy Murphy  Procedure(s) Performed: Procedure(s): OPEN REDUCTION INTERNAL FIXATION (ORIF) ANKLE FRACTURE (Left)  Patient Location: PACU  Anesthesia Type:General  Level of Consciousness: awake, alert , oriented and patient cooperative  Airway & Oxygen Therapy: Patient Spontanous Breathing  Post-op Assessment: Report given to RN and Post -op Vital signs reviewed and stable  Post vital signs: Reviewed and stable  Last Vitals:  Vitals:   03/31/16 1247  BP: (!) 161/87  Pulse: 99  Resp: 18  Temp: 36.8 C    Last Pain:  Vitals:   03/31/16 1247  TempSrc: Oral      Patients Stated Pain Goal: 0 (03/31/16 1246)  Complications: No apparent anesthesia complications

## 2016-03-31 NOTE — Anesthesia Procedure Notes (Signed)
Procedure Name: LMA Insertion Date/Time: 03/31/2016 2:24 PM Performed by: Faustino CongressWHITE, Kajuan Guyton TENA Truda Staub Pre-anesthesia Checklist: Patient identified, Emergency Drugs available, Suction available and Patient being monitored Patient Re-evaluated:Patient Re-evaluated prior to inductionOxygen Delivery Method: Circle System Utilized Preoxygenation: Pre-oxygenation with 100% oxygen Intubation Type: IV induction Ventilation: Mask ventilation without difficulty LMA: LMA inserted LMA Size: 3.0 Number of attempts: 1 Placement Confirmation: positive ETCO2 Tube secured with: Tape Dental Injury: Teeth and Oropharynx as per pre-operative assessment

## 2016-03-31 NOTE — Discharge Instructions (Signed)
-  keep left leg elevated -no weight bearing to left leg -take one 81 mg asa daily for 4 weeks to help prevent blood clots -keep splint clean and dry until follow up -follow up with MD in 2 weeks

## 2016-03-31 NOTE — Brief Op Note (Signed)
03/31/2016  4:07 PM  PATIENT:  Amy MessXanthe M Loughmiller  51 y.o. female  PRE-OPERATIVE DIAGNOSIS:  Left ankle fracture trimalleolar  POST-OPERATIVE DIAGNOSIS:  Left ankle fracture trimalleolar  PROCEDURE:  Procedure(s): OPEN REDUCTION INTERNAL FIXATION (ORIF) ANKLE FRACTURE (Left)  SURGEON:  Surgeon(s) and Role:    * Yolonda KidaJason Patrick Charda Janis, MD - Primary  PHYSICIAN ASSISTANT:   ASSISTANTS: Patrick Jupiterarla Bethune RNFA   ANESTHESIA:   regional and general  EBL:  Total I/O In: 1100 [I.V.:1100] Out: 20 [Blood:20]  BLOOD ADMINISTERED:none  DRAINS: none   LOCAL MEDICATIONS USED:  NONE  SPECIMEN:  No Specimen  DISPOSITION OF SPECIMEN:  N/A  COUNTS:  YES  TOURNIQUET:   Total Tourniquet Time Documented: Thigh (Left) - 81 minutes Total: Thigh (Left) - 81 minutes   DICTATION: .Note written in EPIC  PLAN OF CARE: Admit for overnight observation  PATIENT DISPOSITION:  PACU - hemodynamically stable.   Delay start of Pharmacological VTE agent (>24hrs) due to surgical blood loss or risk of bleeding: not applicable

## 2016-03-31 NOTE — Anesthesia Procedure Notes (Signed)
Anesthesia Regional Block:  Adductor canal block  Pre-Anesthetic Checklist: ,, timeout performed, Correct Patient, Correct Site, Correct Laterality, Correct Procedure, Correct Position, site marked, Risks and benefits discussed,  Surgical consent,  Pre-op evaluation,  At surgeon's request and post-op pain management  Laterality: Left and Lower  Prep: Maximum Sterile Barrier Precautions used, chloraprep       Needles:  Injection technique: Single-shot  Needle Type: Echogenic Stimulator Needle     Needle Length: 10cm 10 cm Needle Gauge: 21 G    Additional Needles:  Procedures: ultrasound guided (picture in chart) Adductor canal block Narrative:  Start time: 03/31/2016 1:40 PM End time: 03/31/2016 1:45 PM Injection made incrementally with aspirations every 5 mL.  Performed by: Personally  Anesthesiologist: Phillips GroutARIGNAN, Joliana Claflin  Additional Notes: Risks, benefits and alternative to block explained extensively.  Patient tolerated procedure well, without complications.

## 2016-04-01 DIAGNOSIS — S82852A Displaced trimalleolar fracture of left lower leg, initial encounter for closed fracture: Secondary | ICD-10-CM | POA: Diagnosis not present

## 2016-04-01 MED ORDER — ACETAMINOPHEN 500 MG PO TABS: 1000.0000 mg | ORAL_TABLET | Freq: Four times a day (QID) | ORAL | 0 refills | Status: AC

## 2016-04-01 NOTE — Evaluation (Signed)
Physical Therapy Evaluation Patient Details Name: Amy Murphy MRN: [id number] DOB: 16-Jun-1965 Today's Date: 04/01/2016   History of Present Illness  Pt is a 51 yo female admitted to [facility] on 03/31/16 for scheduled ORIF to L ankle. Pt had a fall s/p 10 days resulting in a trimalleolar fracture. PMH is unremarkable.    Clinical Impression  Pt is POD 1 following the above procedure. Prior to injury and admission, pt was completely independent and working full time. Pt lives with her parents in their two level home but can stay on the main level at this time. Pt is currently receiving assistance from a paid CG 3 hrs a day and has assistance from her family as needed. Pt will benefit from HHPT upon discharge in order to maximize her functional outcomes and assist with maintaining strength until she is able to weightbear through LLE. Pt will benefit from continued acute PT services to address below deficits before discharge.     Follow Up Recommendations Home health PT;Supervision for mobility/OOB    Equipment Recommendations  None recommended by PT    Recommendations for Other Services       Precautions / Restrictions Precautions Precautions: Fall Restrictions Weight Bearing Restrictions: Yes LLE Weight Bearing: Non weight bearing      Mobility  Bed Mobility Overal bed mobility: Needs Assistance Bed Mobility: Supine to Sit     Supine to sit: Mod assist     General bed mobility comments: Mod A to bring LLE EOB  Transfers Overall transfer level: Needs assistance Equipment used: Rolling walker (2 wheeled) Transfers: Sit to/from [employer] Sit to Stand: Min assist Stand pivot transfers: Mod assist       General transfer comment: MIn A from EOB and MOD A for stand pivot to recliner with cues to maintain NWB  Ambulation/Gait             General Gait Details: unable to assess this session  Stairs            Wheelchair Mobility    Modified Rankin  (Stroke Patients Only)       Balance                                             Pertinent Vitals/Pain Pain Assessment: 0-10 Pain Score: 7  Pain Location: left Pain Descriptors / Indicators: Aching Pain Intervention(s): Limited activity within patient's tolerance;Monitored during session;Repositioned;Patient requesting pain meds-RN notified    Home Living Family/patient expects to be discharged to:: Private residence Living Arrangements: Parent Available Help at Discharge: Family;Available 24 hours/day;Personal care attendant Type of Home: House Home Access: Ramped entrance     Home Layout: Two level;Able to live on main level with bedroom/bathroom Home Equipment: Wheelchair - manual;Bedside commode;Walker - 2 wheels Additional Comments: Pt has nurse who comes in to assist 3 hrs a day    Prior Function Level of Independence: Independent         Comments: was completely independent and working as a Conservation officer, 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     [employer]   Dominant Hand: Left    Extremity/Trunk Assessment   Upper Extremity Assessment Upper Extremity Assessment: Defer to OT evaluation    Lower Extremity Assessment Lower Extremity Assessment: LLE deficits/detail LLE: Unable to fully assess due to immobilization    Cervical / Trunk Assessment Cervical / Trunk  Assessment: Normal  Communication   Communication: No difficulties  Cognition Arousal/Alertness: Awake/alert Behavior During Therapy: WFL for tasks assessed/performed Overall Cognitive Status: Within Functional Limits for tasks assessed                      General Comments General comments (skin integrity, edema, etc.): Mother present throughout session but did not speak to clincians.     Exercises General Exercises - Lower Extremity Ankle Circles/Pumps: AROM;Right;10 reps;Supine Quad Sets: AROM;Both;10 reps;Supine   Assessment/Plan    PT Assessment Patient needs  continued PT services  PT Problem List Decreased strength;Decreased activity tolerance;Decreased balance;Decreased mobility;Decreased knowledge of use of DME;Pain          PT Treatment Interventions DME instruction;Gait training;Stair training;Functional mobility training;Therapeutic activities;Therapeutic exercise;Balance training;Patient/family education    PT Goals (Current goals can be found in the Care Plan section)  Acute Rehab PT Goals Patient Stated Goal: to get home and walk again PT Goal Formulation: With patient Time For Goal Achievement: 04/08/16 Potential to Achieve Goals: Good    Frequency Min 5X/week   Barriers to discharge        Co-evaluation               End of Session Equipment Utilized During Treatment: Gait belt Activity Tolerance: Patient tolerated treatment well;Patient limited by lethargy Patient left: in chair;with call bell/phone within reach;with family/visitor present Nurse Communication: Mobility status    Functional Assessment Tool Used: clinical judgement, mobility assessment Functional Limitation: Mobility: Walking and moving around Mobility: Walking and Moving Around Current Status [phone](G8978): At least 20 percent but less than 40 percent impaired, limited or restricted Mobility: Walking and Moving Around Goal Status [phone](G8979): At least 1 percent but less than 20 percent impaired, limited or restricted    Time: 1003-1028 PT Time Calculation (min) (ACUTE ONLY): 25 min   Charges:   PT Evaluation $PT Eval Moderate Complexity: 1 Procedure PT Treatments $Therapeutic Activity: 8-22 mins   PT G Codes:   PT G-Codes **NOT FOR INPATIENT CLASS** Functional Assessment Tool Used: clinical judgement, mobility assessment Functional Limitation: Mobility: Walking and moving around Mobility: Walking and Moving Around Current Status ([id number](G8978): At least 20 percent but less than 40 percent impaired, limited or restricted Mobility: Walking and Moving Around  Goal Status [phone](G8979): At least 1 percent but less than 20 percent impaired, limited or restricted    Colin BroachSabra M. Wyman Meschke PT, DPT  [phone]  04/01/2016, 12:59 PM

## 2016-04-01 NOTE — Care Management Note (Signed)
Case Management Note  Patient Details  Name: Blase MessXanthe M Douthat MRN: [id number] Date of Birth: 07-14-1965  Subjective/Objective:   51 yr old female s/p left ankle ORIF.                 Action/Plan: Case manager spoke with patient concerning Home Health needs. Choice was offered for Home Health Agency. Patient states she has a wheel chair. Referral for Home Health PT was called to Shaune LeeksJermaine Jenkins, [facility] Lia[facility]. Patient will have family support at discharge.   Expected Discharge Plan:  Home w Home Health Services  In-House Referral:  NA  Discharge planning Services  CM Consult  Post Acute Care Choice:  Home Health Choice offered to:  Patient  DME Arranged:   NA DME Agency:     HH Arranged:  PT HH Agency:  [facility]  Status of Service:  Completed, signed off  If discussed at Long Length of Stay Meetings, dates discussed:    Additional Comments:  Durenda GuthrieBrady, Laurin Morgenstern Naomi, RN 04/01/2016, 12:38 PM

## 2016-04-01 NOTE — Evaluation (Signed)
Occupational Therapy Evaluation Patient Details Name: Amy Murphy MRN: [id number] DOB: 1965-08-05 Today's Date: 04/01/2016    History of Present Illness Pt is a 51 yo female admitted to [facility] on 03/31/16 for scheduled ORIF to L ankle. Pt had a fall s/p 10 days resulting in a trimalleolar fracture. PMH is unremarkable.     Clinical Impression   Since fall, pt has required assist with ADL. Currently pt requires max assist for LB ADL and mod assist for stand pivot transfers. Pt with difficulty maintaining NWB on LLE during transfers and functional activities despite max cues. Pt planning to d/c home with 24/7 supervision from family. Recommending HHOT for follow up to maximize independence and safety with ADL and functional mobility upon return home. Pt would benefit from continued skilled OT to address established goals.    Follow Up Recommendations  Home health OT;Supervision/Assistance - 24 hour    Equipment Recommendations  None recommended by OT    Recommendations for Other Services       Precautions / Restrictions Precautions Precautions: Fall Restrictions Weight Bearing Restrictions: Yes LLE Weight Bearing: Non weight bearing      Mobility Bed Mobility Overal bed mobility: Needs Assistance Bed Mobility: Sit to Supine     Supine to sit: Mod assist Sit to supine: Min guard   General bed mobility comments: Pt able to manage LEs back to bed and scoot self up toward [facility] without assist. HOB flat without use of bed rail  Transfers Overall transfer level: Needs assistance Equipment used: Rolling walker (2 wheeled) Transfers: Sit to/from [employer] Sit to Stand: Min assist Stand pivot transfers: Mod assist       General transfer comment: Cues for hand placement and maintaining NWB on LLE; pt with difficulty maintaining WB status    Balance Overall balance assessment: Needs assistance Sitting-balance support: Feet supported;No upper extremity  supported Sitting balance-Leahy Scale: Good     Standing balance support: Bilateral upper extremity supported Standing balance-Leahy Scale: Poor                              ADL Overall ADL's : Needs assistance/impaired Eating/Feeding: Set up;Sitting   Grooming: Set up;Supervision/safety;Sitting   Upper Body Bathing: Set up;Supervision/ safety;Sitting   Lower Body Bathing: Maximal assistance;Sit to/from stand   Upper Body Dressing : Set up;Supervision/safety;Sitting   Lower Body Dressing: Maximal assistance;Sit to/from stand Lower Body Dressing Details (indicate cue type and reason): Assist to start pants over feet and to pull up in standing Toilet Transfer: Moderate assistance;Stand-pivot;BSC;RW Toilet Transfer Details (indicate cue type and reason): Simulated by stand pivot from chair to bed; pt with difficulty maintaining NWB status on LLE.         Functional mobility during ADLs: Moderate assistance;Rolling walker (for stand pivot only) General ADL Comments: Educated pt on importance of maintaining LLE NWB status during functional activities and transfers; pt verbalized understanding but continues to have difficulty maintaining during activity.     Vision     Perception     Praxis      Pertinent Vitals/Pain Pain Assessment: Faces Pain Score: 7  Faces Pain Scale: Hurts little more Pain Location: LLE Pain Descriptors / Indicators: Sore;Aching Pain Intervention(s): Limited activity within patient's tolerance;Monitored during session;Repositioned     Hand Dominance Left   Extremity/Trunk Assessment Upper Extremity Assessment Upper Extremity Assessment: Overall WFL for tasks assessed   Lower Extremity Assessment Lower Extremity Assessment: Defer  to PT evaluation LLE: Unable to fully assess due to immobilization   Cervical / Trunk Assessment Cervical / Trunk Assessment: Normal   Communication Communication Communication: No difficulties    Cognition Arousal/Alertness: Awake/alert Behavior During Therapy: WFL for tasks assessed/performed Overall Cognitive Status: Within Functional Limits for tasks assessed                     General Comments       Exercises Exercises: General Lower Extremity     Shoulder Instructions      Home Living Family/patient expects to be discharged to:: Private residence Living Arrangements: Parent Available Help at Discharge: Family;Available 24 hours/day;Personal care attendant Type of Home: House Home Access: Ramped entrance     Home Layout: Two level;Able to live on main level with bedroom/bathroom Alternate Level Stairs-Number of Steps: 13 Alternate Level Stairs-Rails: Right Bathroom Shower/Tub: Tub/shower unit;Curtain   Bathroom Toilet: Standard Bathroom Accessibility: Yes How Accessible: Accessible via walker Home Equipment: Wheelchair - manual;Bedside commode;Walker - 2 wheels   Additional Comments: Pt has nurse who comes in to assist 3 hrs a day, 5 days per week      Prior Functioning/Environment Level of Independence: Independent        Comments: was completely independent and working as a Conservation officer, 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 Since injury pt has been at w/c level in home and has required assist with bathing and dressing.        OT Problem List: Decreased strength;Decreased activity tolerance;Impaired balance (sitting and/or standing);Decreased safety awareness;Decreased knowledge of use of DME or AE;Decreased knowledge of precautions;Pain;Increased edema   OT Treatment/Interventions: Self-care/ADL training;Energy conservation;DME and/or AE instruction;Therapeutic activities;Patient/family education;Balance training    OT Goals(Current goals can be found in the care plan section) Acute Rehab OT Goals Patient Stated Goal: to get home and walk again OT Goal Formulation: With patient/family Time For Goal Achievement: 04/15/16 Potential to Achieve Goals:  Good ADL Goals Pt Will Perform Lower Body Bathing: with supervision;sit to/from stand Pt Will Perform Lower Body Dressing: with supervision;sit to/from stand Pt Will Transfer to Toilet: with supervision;stand pivot transfer;bedside commode Pt Will Perform Toileting - Clothing Manipulation and hygiene: with supervision;sit to/from stand  OT Frequency: Min 2X/week   Barriers to D/C:            Co-evaluation              End of Session Equipment Utilized During Treatment: Gait belt;Rolling walker  Activity Tolerance: Patient tolerated treatment well Patient left: in bed;with call bell/phone within reach;with family/visitor present   Time: [id number] OT Time Calculation (min): 18 min Charges:  OT General Charges $OT Visit: 1 Procedure OT Evaluation $OT Eval Moderate Complexity: 1 Procedure G-Codes: OT G-codes **NOT FOR INPATIENT CLASS** Functional Assessment Tool Used: Clinical judgement Functional Limitation: Self care Self Care Current Status (Q4696(G8987): At least 40 percent but less than 60 percent impaired, limited or restricted Self Care Goal Status (E9528(G8988): At least 1 percent but less than 20 percent impaired, limited or restricted   Gaye AlkenBailey A Lilac Hoff M.S., OTR/L Pager: [phone]  04/01/2016, 2:27 PM

## 2016-04-01 NOTE — Progress Notes (Signed)
     Subjective: 1 Day Post-Op Procedure(s) (LRB): OPEN REDUCTION INTERNAL FIXATION (ORIF) ANKLE FRACTURE (Left)   Patient reports pain as mild, pain controlled.  No events throughout the night.  She feels that she is doing well. Ready to be discharged home.  Objective:   VITALS:   Vitals:   04/01/16 0030 04/01/16 0517  BP: 125/64 113/60  Pulse: 76 71  Resp: 16 16  Temp: 97.4 F (36.3 C) 97.9 F (36.6 C)    Dorsiflexion/Plantar flexion intact Incision: dressing C/D/I No cellulitis present Compartment soft  LABS  Recent Labs  03/29/16 1357 03/31/16 2048  HGB 13.9 13.4  HCT 41.9 41.3  WBC 5.7 6.9  PLT 314 286     Recent Labs  03/31/16 2048  CREATININE 0.61     Assessment/Plan: 1 Day Post-Op Procedure(s) (LRB): OPEN REDUCTION INTERNAL FIXATION (ORIF) ANKLE FRACTURE (Left) Advance diet Up with therapy - NWB left leg D/C IV fluids Discharge home [facility] Follow up with Dr. Aundria Rudogers in 2 weeks.  Contact information:  [facility] [address] [place] [id number] [phone]        Anastasio AuerbachMatthew S. [name]   PAC  04/01/2016, 12:50 PM

## 2016-04-02 NOTE — Anesthesia Postprocedure Evaluation (Signed)
Anesthesia Post Note  Patient: Amy Murphy  Procedure(s) Performed: Procedure(s) (LRB): OPEN REDUCTION INTERNAL FIXATION (ORIF) ANKLE FRACTURE (Left)  Patient location during evaluation: PACU Anesthesia Type: General and Regional Level of consciousness: awake and alert Pain management: pain level controlled Vital Signs Assessment: post-procedure vital signs reviewed and stable Respiratory status: spontaneous breathing, nonlabored ventilation, respiratory function stable and patient connected to nasal cannula oxygen Cardiovascular status: blood pressure returned to baseline and stable Postop Assessment: no signs of nausea or vomiting Anesthetic complications: no        Last Vitals:  Vitals:   04/01/16 0517 04/01/16 1300  BP: 113/60 116/64  Pulse: 71 74  Resp: 16 16  Temp: 36.6 C 36.6 C    Last Pain:  Vitals:   04/01/16 1326  TempSrc:   PainSc: 0-No pain   Pain Goal: Patients Stated Pain Goal: 0 (03/31/16 1246)               Phillips Groutarignan, Tally Mattox

## 2016-04-04 ENCOUNTER — Encounter (HOSPITAL_COMMUNITY): Payer: Self-pay | Admitting: Orthopedic Surgery

## 2016-04-06 NOTE — Discharge Summary (Signed)
Physician Discharge Summary  Patient ID: MONET NORTH MRN: [id number] DOB/AGE: 53-Apr-1967 53 y.o.  Admit date: 03/31/2016 Discharge date: 04/01/2016   Procedures:  Procedure(s) (LRB): OPEN REDUCTION INTERNAL FIXATION (ORIF) ANKLE FRACTURE (Left)  Attending Physician:  Dr. Duwayne Heck  Admission Diagnoses:   Left ankle fracture  Discharge Diagnoses:  Active Problems:   Trimalleolar fracture of ankle, closed, left, initial encounter   Closed displaced trimalleolar fracture of left ankle  Past Medical History:  Diagnosis Date  . Closed left ankle fracture   . Seasonal allergies   . Wears glasses     HPI:    Amy Murphy is a 51 y.o. female who complains of left ankle fracture.  She has been seen and evaluated by Dr. Aundria Rud in the office and recommended surgical management.  Dr. Aundria Rud reviewed the case with her and her family and she elected to proceed with  operative fixation.   PCP: No PCP Per Patient   Discharged Condition: good  Hospital Course:  Patient underwent the above stated procedure on 03/31/2016. Patient tolerated the procedure well and brought to the recovery room in good condition and subsequently to the floor.  POD #1 BP: 113/60 ; Pulse: 71 ; Temp: 97.9 F (36.6 C) ; Resp: 16 Patient reports pain as mild, pain controlled.  No events throughout the night.  She feels that she is doing well. Ready to be discharged home. Splint C/D/I, no cellulitis present and compartment soft.   LABS  Basename    HGB     13.4  HCT     41.3    Discharge Exam: General appearance: alert, cooperative and no distress Extremities: Homans sign is negative, no sign of DVT, no edema, redness or tenderness in the calves or thighs and no ulcers, gangrene or trophic changes  Disposition: Home with follow up in 2 weeks   Follow-up Information    Yolonda Kida, MD. Schedule an appointment as soon as possible for a visit in 2 week(s).   Specialty:  Orthopedic  Surgery Contact information: [address] [place] [address] [phone]        [facility] Follow up.   Why:  Someone from [facility] will contact you to arrange start date and time for therapy. Contact information: [address] [place] [address] [phone]           Discharge Instructions    Face-to-face encounter (required for Medicare/Medicaid patients)    Complete by:  As directed    I Yolonda Kida certify that this patient is under my care and that I, or a nurse practitioner or physician's assistant working with me, had a face-to-face encounter that meets the physician face-to-face encounter requirements with this patient on 03/31/2016. The encounter with the patient was in whole, or in part for the following medical condition(s) which is the primary reason for home health care (List medical condition): decreased mobility following ankle surgery and NWB status   The encounter with the patient was in whole, or in part, for the following medical condition, which is the primary reason for home health care:  s/p ankle fracture and decreased mobility   I certify that, based on my findings, the following services are medically necessary home health services:   Nursing Physical therapy     Reason for Medically Necessary Home Health Services:  Therapy- Instruction on use of Assistive Device for Ambulation on all Surfaces   My clinical  findings support the need for the above services:  Unable to leave home safely without assistance and/or assistive device   Further, I certify that my clinical findings support that this patient is homebound due to:  Unable to leave home safely without assistance   Home Health    Complete by:  As directed    To provide the following care/treatments:   RN PT     Non weight bearing    Complete by:  As directed    Laterality:  left   Extremity:  Lower      Allergies as of 04/01/2016       Reactions   Aspirin Other (See Comments)   Nosebleeds   Ibuprofen Other (See Comments)   Vaginal bleeding      Medication List    STOP taking these medications   oxyCODONE-acetaminophen 5-325 MG tablet Commonly known as:  PERCOCET/ROXICET     TAKE these medications   acetaminophen 500 MG tablet Commonly known as:  TYLENOL Take 2 tablets (1,000 mg total) by mouth every 6 (six) hours.   aspirin EC 81 MG tablet Take 1 tablet (81 mg total) by mouth daily.   ondansetron 4 MG disintegrating tablet Commonly known as:  ZOFRAN ODT Take 1 tablet (4 mg total) by mouth every 8 (eight) hours as needed for nausea or vomiting.   oxyCODONE 5 MG immediate release tablet Commonly known as:  ROXICODONE Take 1-2 tablets (5-10 mg total) by mouth every 4 (four) hours as needed for severe pain.        Signed: Anastasio AuerbachMatthew S. Jaquelyn Sakamoto   PA-C  04/06/2016, 10:23 AM

## 2017-06-07 IMAGING — RF DG ANKLE 2V *L*
1 series · 6 of 6 positions shown · non-contrast
Comparison: None

CLINICAL DATA: LEFT ankle surgery

EXAM:
LEFT ANKLE - 2 VIEW; DG C-ARM 61-120 MIN

[Series 1: run · 6 of 6 slices shown]
[im 1/6]
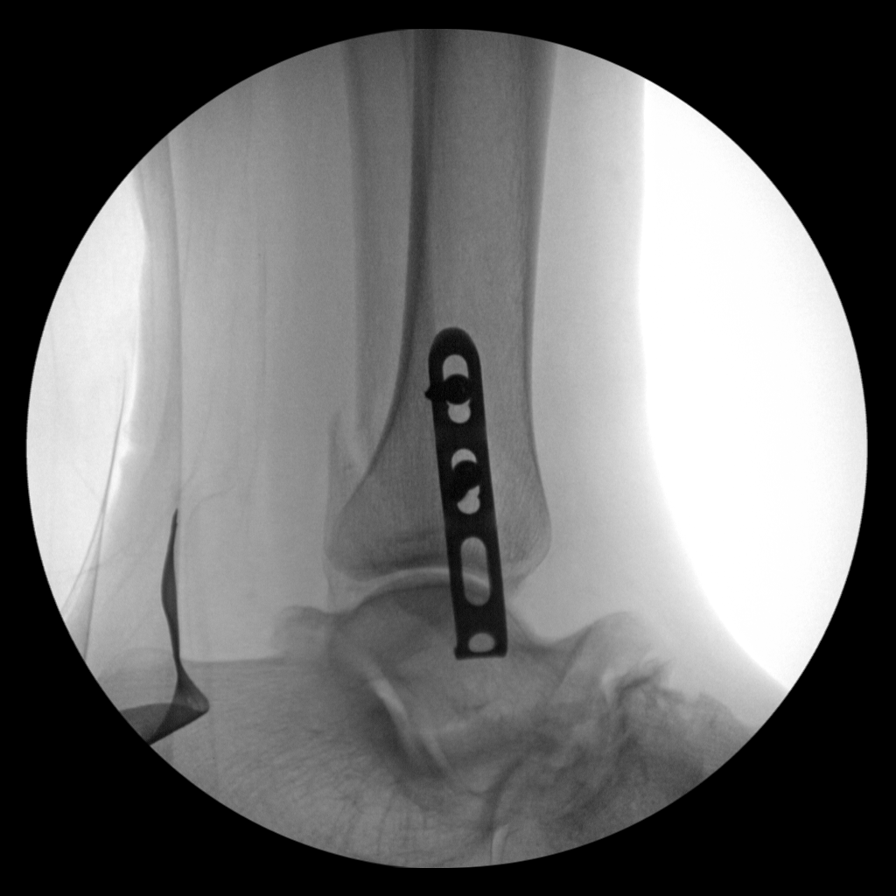
[im 2/6]
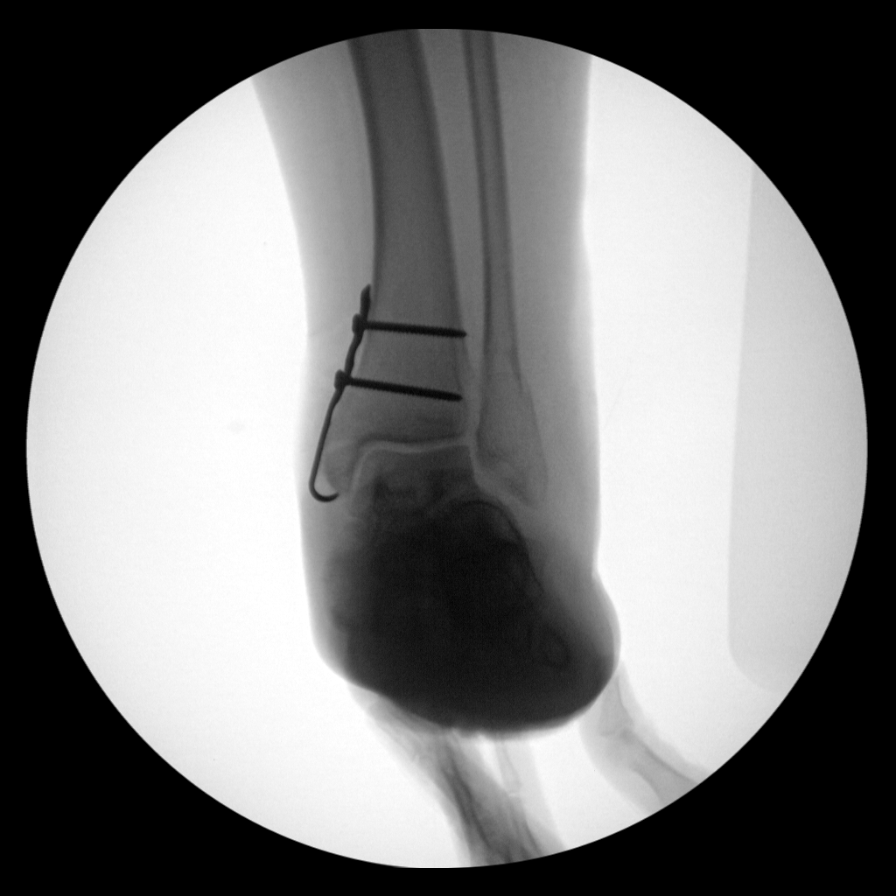
[im 3/6]
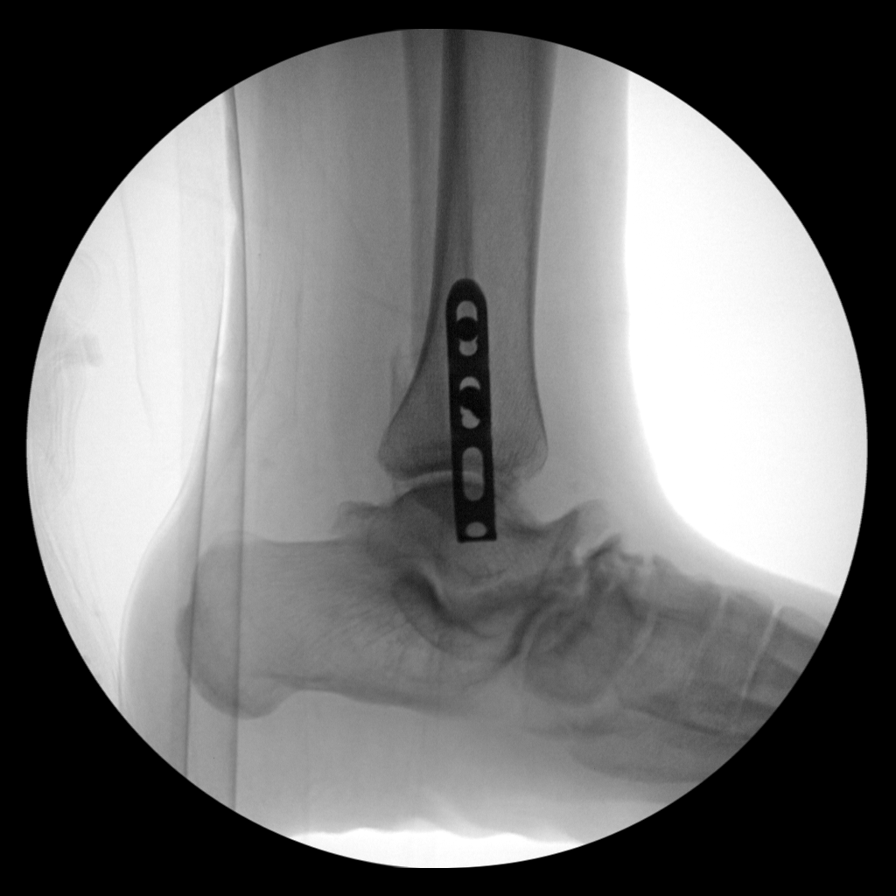
[im 4/6]
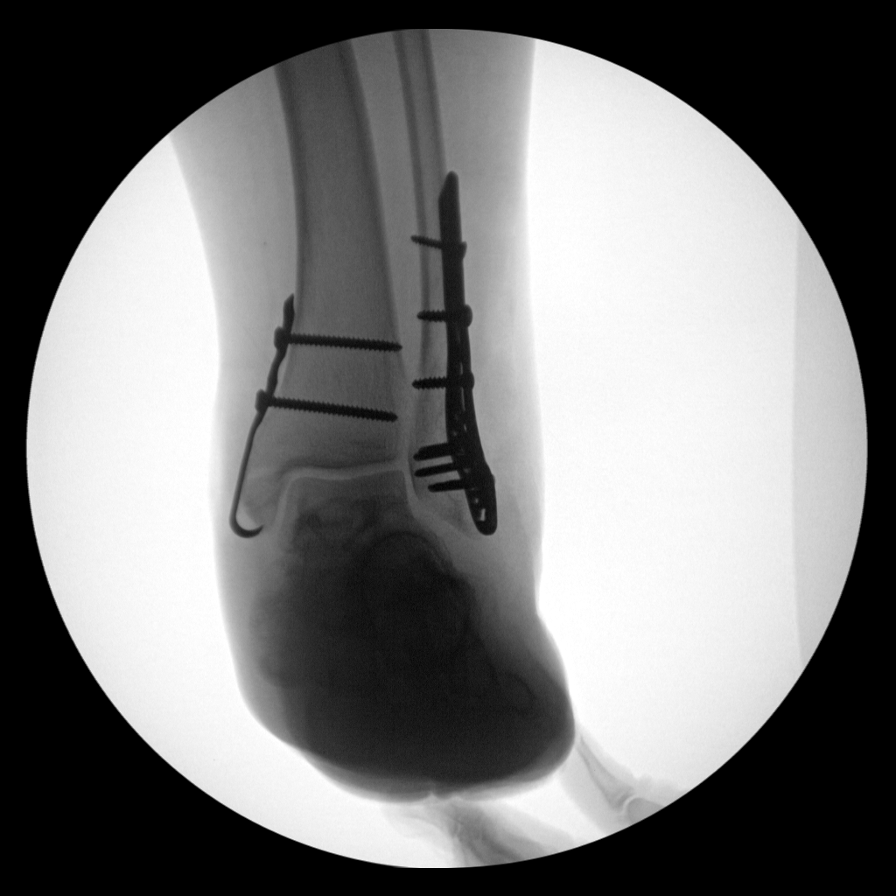
[im 5/6]
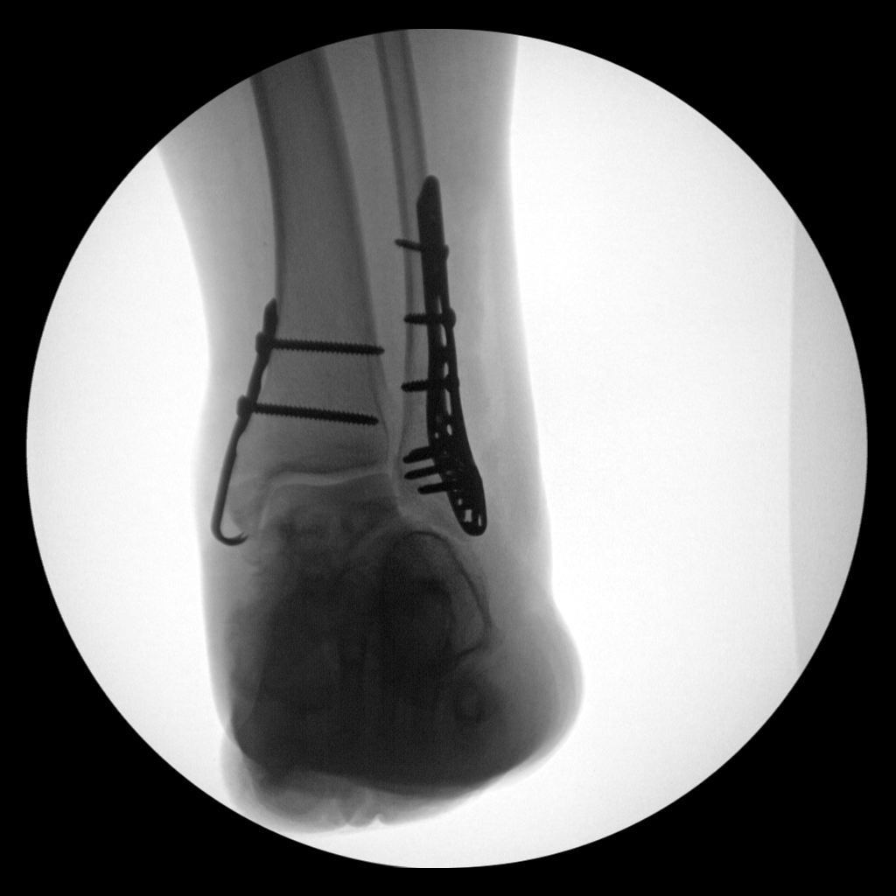
[im 6/6]
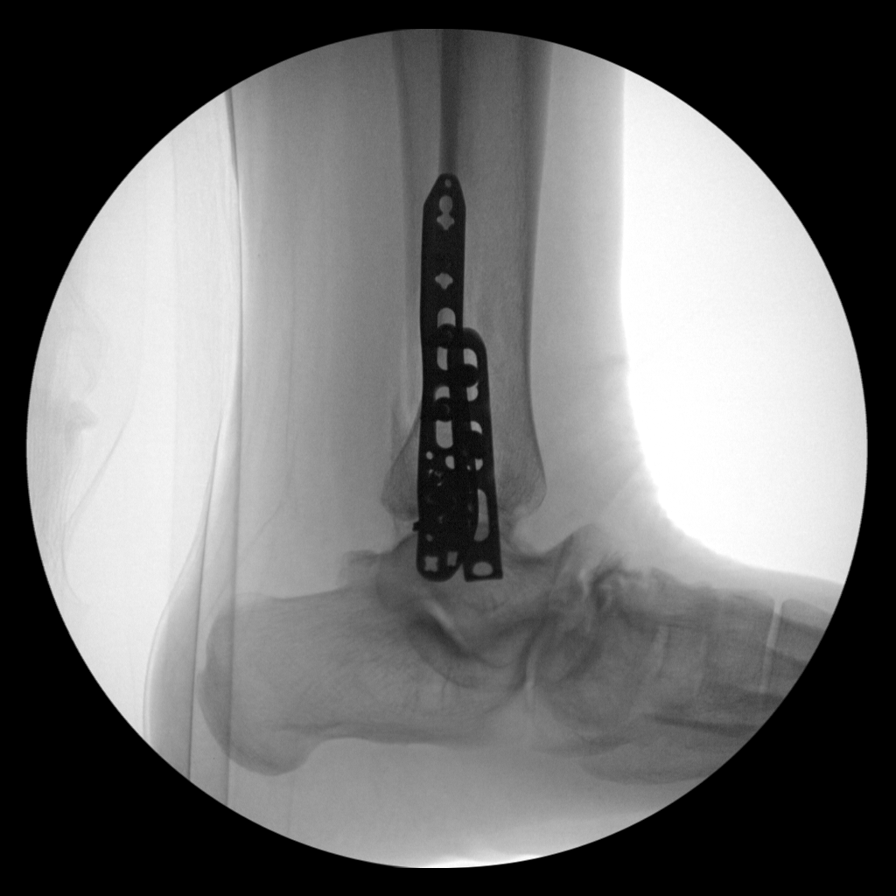

[6 of 6 positions shown; findings below may reference images not displayed]

FLUOROSCOPY TIME:  Time:  0 minutes 35 seconds

Dose:  Not provided

Images obtained:  6
FINDINGS: Images demonstrate placement of a medial plate and screws across day
reduced fracture of the medial malleolus. An additional lateral
plate and screws have been placed across a reduced lateral malleolar
fracture. Ankle joint space preserved. Talonavicular degenerative
changes noted.
IMPRESSION: Post ORIF of medial and lateral malleolar fractures.
